# Patient Record
Sex: Male | Born: 1980 | Race: Black or African American | Hispanic: No | Marital: Single | Smoking: Current every day smoker
Health system: Southern US, Community
[De-identification: ages and names within clinical notes are randomized; demographics above are authoritative.]

## PROBLEM LIST (undated history)

## (undated) DIAGNOSIS — G4730 Sleep apnea, unspecified: Secondary | ICD-10-CM

## (undated) DIAGNOSIS — I10 Essential (primary) hypertension: Secondary | ICD-10-CM

---

## 2008-06-10 ENCOUNTER — Emergency Department (HOSPITAL_COMMUNITY): Admission: EM | Admit: 2008-06-10 | Discharge: 2008-06-10 | Payer: Self-pay | Admitting: Emergency Medicine

## 2008-06-13 ENCOUNTER — Emergency Department (HOSPITAL_COMMUNITY): Admission: EM | Admit: 2008-06-13 | Discharge: 2008-06-13 | Payer: Self-pay | Admitting: Emergency Medicine

## 2009-02-03 ENCOUNTER — Emergency Department (HOSPITAL_COMMUNITY): Admission: EM | Admit: 2009-02-03 | Discharge: 2009-02-04 | Payer: Self-pay | Admitting: Emergency Medicine

## 2009-02-07 ENCOUNTER — Ambulatory Visit: Payer: Self-pay | Admitting: Internal Medicine

## 2009-02-07 DIAGNOSIS — R030 Elevated blood-pressure reading, without diagnosis of hypertension: Secondary | ICD-10-CM

## 2009-02-07 DIAGNOSIS — K219 Gastro-esophageal reflux disease without esophagitis: Secondary | ICD-10-CM

## 2009-02-07 DIAGNOSIS — J309 Allergic rhinitis, unspecified: Secondary | ICD-10-CM | POA: Insufficient documentation

## 2009-02-07 DIAGNOSIS — M549 Dorsalgia, unspecified: Secondary | ICD-10-CM | POA: Insufficient documentation

## 2009-02-07 DIAGNOSIS — M25569 Pain in unspecified knee: Secondary | ICD-10-CM

## 2009-02-07 DIAGNOSIS — R1013 Epigastric pain: Secondary | ICD-10-CM

## 2009-02-07 DIAGNOSIS — R21 Rash and other nonspecific skin eruption: Secondary | ICD-10-CM

## 2009-02-07 DIAGNOSIS — J45909 Unspecified asthma, uncomplicated: Secondary | ICD-10-CM | POA: Insufficient documentation

## 2009-02-07 DIAGNOSIS — G4733 Obstructive sleep apnea (adult) (pediatric): Secondary | ICD-10-CM

## 2009-02-08 LAB — CONVERTED CEMR LAB
ALT: 22 units/L (ref 0–53)
AST: 22 units/L (ref 0–37)
Albumin: 4 g/dL (ref 3.5–5.2)
Basophils Absolute: 0.2 10*3/uL — ABNORMAL HIGH (ref 0.0–0.1)
CO2: 29 meq/L (ref 19–32)
GFR calc non Af Amer: 113.46 mL/min (ref >60)
Glucose, Bld: 98 mg/dL (ref 70–99)
HCT: 42.5 % (ref 39.0–52.0)
Hemoglobin: 14.3 g/dL (ref 13.0–17.0)
Lymphs Abs: 1.8 10*3/uL (ref 0.7–4.0)
Monocytes Relative: 10.3 % (ref 3.0–12.0)
Neutro Abs: 2.8 10*3/uL (ref 1.4–7.7)
Potassium: 3.9 meq/L (ref 3.5–5.1)
RDW: 15.2 % — ABNORMAL HIGH (ref 11.5–14.6)
Sodium: 142 meq/L (ref 135–145)
TSH: 0.69 microintl units/mL (ref 0.35–5.50)
Total Protein: 7.3 g/dL (ref 6.0–8.3)

## 2009-02-09 ENCOUNTER — Encounter: Payer: Self-pay | Admitting: Internal Medicine

## 2009-02-09 DIAGNOSIS — A6000 Herpesviral infection of urogenital system, unspecified: Secondary | ICD-10-CM | POA: Insufficient documentation

## 2009-02-09 LAB — CONVERTED CEMR LAB

## 2009-02-20 ENCOUNTER — Telehealth (INDEPENDENT_AMBULATORY_CARE_PROVIDER_SITE_OTHER): Payer: Self-pay | Admitting: *Deleted

## 2009-02-22 ENCOUNTER — Emergency Department (HOSPITAL_COMMUNITY): Admission: EM | Admit: 2009-02-22 | Discharge: 2009-02-22 | Payer: Self-pay | Admitting: Emergency Medicine

## 2009-02-22 ENCOUNTER — Telehealth: Payer: Self-pay | Admitting: Internal Medicine

## 2009-03-08 ENCOUNTER — Emergency Department (HOSPITAL_COMMUNITY): Admission: EM | Admit: 2009-03-08 | Discharge: 2009-03-08 | Payer: Self-pay | Admitting: Emergency Medicine

## 2009-05-06 ENCOUNTER — Emergency Department (HOSPITAL_COMMUNITY): Admission: EM | Admit: 2009-05-06 | Discharge: 2009-05-06 | Payer: Self-pay | Admitting: Emergency Medicine

## 2010-04-03 IMAGING — CR DG HAND COMPLETE 3+V*R*
3 series · 3 of 3 positions shown · non-contrast
Comparison: None

CLINICAL DATA: Right hand injury

RIGHT HAND - COMPLETE 3+ VIEW

[x hand ap right]
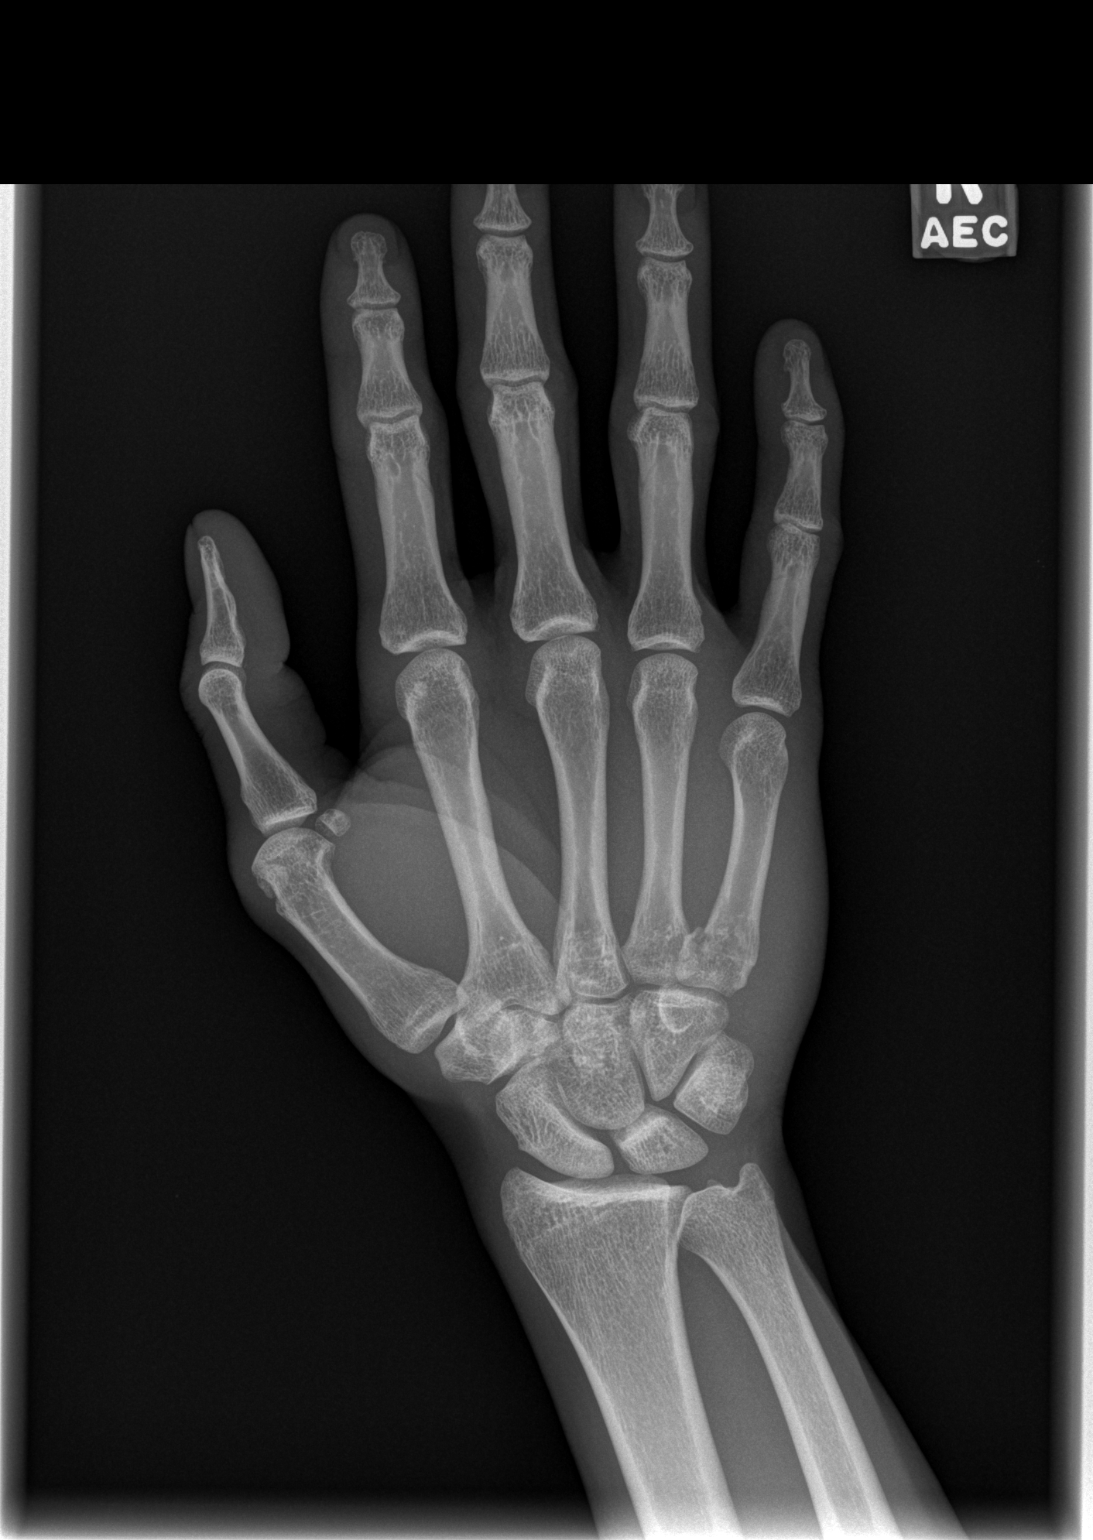

[x hand oblique right]
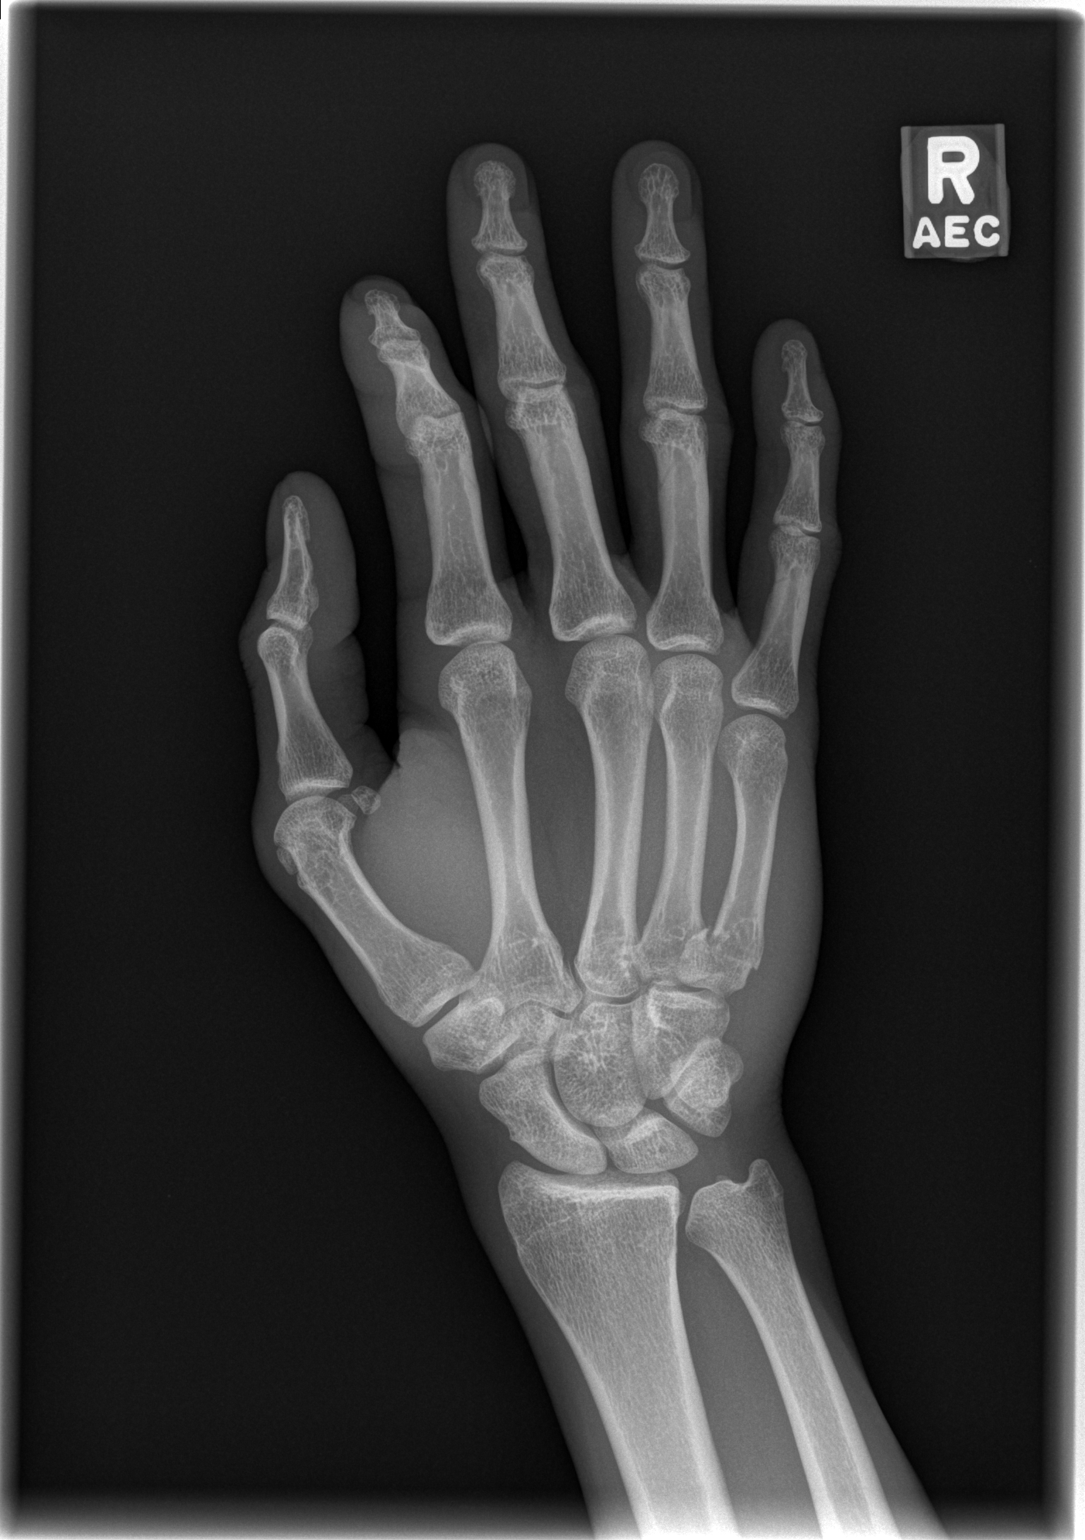

[x hand lat right]
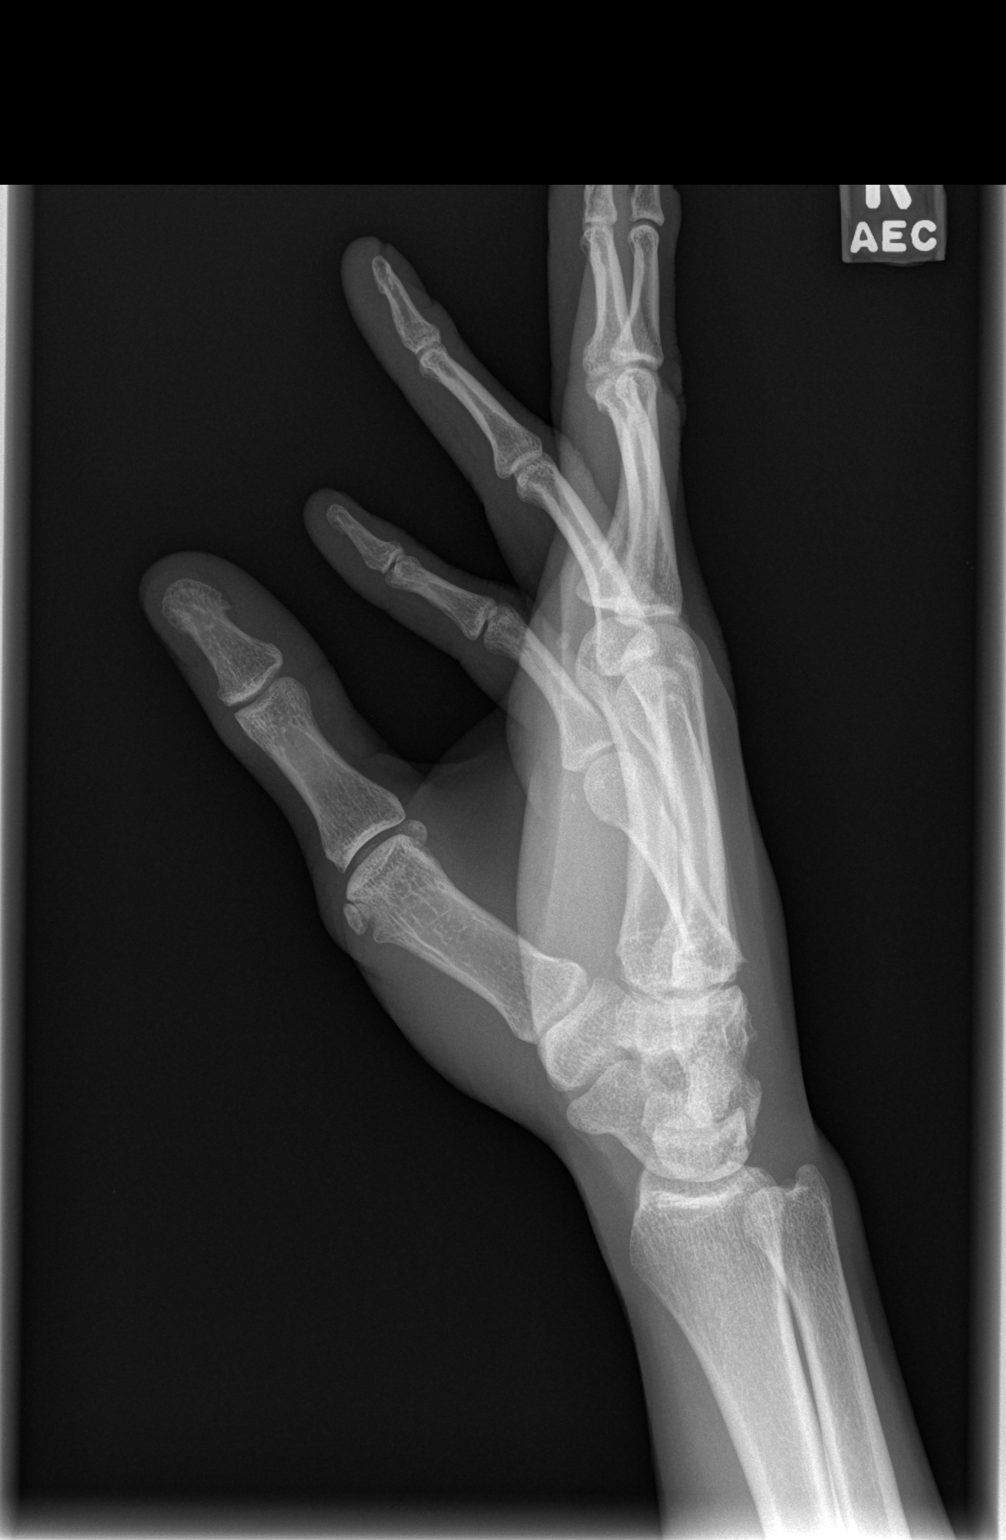

[3 of 3 positions shown; findings below may reference images not displayed]

FINDINGS: There is a minimally displaced fracture at the base of
the fifth metacarpal with overlying soft tissue swelling.  No
associated dislocation.  No other fractures are identified.
IMPRESSION: Mildly displaced fracture of proximal fifth metacarpal.

## 2010-10-13 ENCOUNTER — Emergency Department (HOSPITAL_COMMUNITY)
Admission: EM | Admit: 2010-10-13 | Discharge: 2010-10-13 | Payer: Self-pay | Source: Home / Self Care | Admitting: Emergency Medicine

## 2010-10-14 LAB — MAGNESIUM: Magnesium: 2.3 mg/dL (ref 1.5–2.5)

## 2010-10-14 LAB — RAPID URINE DRUG SCREEN, HOSP PERFORMED
Amphetamines: NOT DETECTED
Barbiturates: NOT DETECTED

## 2010-10-14 LAB — POCT I-STAT, CHEM 8
HCT: 47 % (ref 39.0–52.0)
Hemoglobin: 16 g/dL (ref 13.0–17.0)
Potassium: 3.5 mEq/L (ref 3.5–5.1)
Sodium: 140 mEq/L (ref 135–145)

## 2010-10-14 LAB — PHOSPHORUS: Phosphorus: 3.4 mg/dL (ref 2.3–4.6)

## 2011-03-12 ENCOUNTER — Emergency Department (HOSPITAL_COMMUNITY)
Admission: EM | Admit: 2011-03-12 | Discharge: 2011-03-12 | Disposition: A | Discharge status: Home / Self Care | Payer: Self-pay | Attending: Emergency Medicine | Admitting: Emergency Medicine

## 2011-03-12 DIAGNOSIS — R51 Headache: Secondary | ICD-10-CM | POA: Insufficient documentation

## 2011-03-12 DIAGNOSIS — R071 Chest pain on breathing: Secondary | ICD-10-CM | POA: Insufficient documentation

## 2011-03-12 DIAGNOSIS — K921 Melena: Secondary | ICD-10-CM | POA: Insufficient documentation

## 2011-03-12 DIAGNOSIS — K649 Unspecified hemorrhoids: Secondary | ICD-10-CM | POA: Insufficient documentation

## 2011-03-12 DIAGNOSIS — I10 Essential (primary) hypertension: Secondary | ICD-10-CM | POA: Insufficient documentation

## 2011-03-12 DIAGNOSIS — R5381 Other malaise: Secondary | ICD-10-CM | POA: Insufficient documentation

## 2011-03-12 LAB — COMPREHENSIVE METABOLIC PANEL
ALT: 24 U/L (ref 0–53)
CO2: 26 mEq/L (ref 19–32)
Calcium: 9.7 mg/dL (ref 8.4–10.5)
Chloride: 99 mEq/L (ref 96–112)
Creatinine, Ser: 1.02 mg/dL (ref 0.50–1.35)
GFR calc Af Amer: >60 mL/min (ref >60)
GFR calc non Af Amer: >60 mL/min (ref >60)
Glucose, Bld: 135 mg/dL — ABNORMAL HIGH (ref 70–99)
Sodium: 137 mEq/L (ref 135–145)
Total Bilirubin: 0.5 mg/dL (ref 0.3–1.2)

## 2011-03-12 LAB — CBC
Hemoglobin: 14.3 g/dL (ref 13.0–17.0)
MCH: 24.2 pg — ABNORMAL LOW (ref 26.0–34.0)
MCV: 72.8 fL — ABNORMAL LOW (ref 78.0–100.0)
RBC: 5.92 MIL/uL — ABNORMAL HIGH (ref 4.22–5.81)
WBC: 12.9 10*3/uL — ABNORMAL HIGH (ref 4.0–10.5)

## 2011-03-12 LAB — URINALYSIS, ROUTINE W REFLEX MICROSCOPIC
Glucose, UA: NEGATIVE mg/dL
Hgb urine dipstick: NEGATIVE
Specific Gravity, Urine: 1.024 (ref 1.005–1.030)
pH: 5.5 (ref 5.0–8.0)

## 2011-03-12 LAB — DIFFERENTIAL
Lymphocytes Relative: 11 % — ABNORMAL LOW (ref 12–46)
Lymphs Abs: 1.4 10*3/uL (ref 0.7–4.0)
Monocytes Relative: 6 % (ref 3–12)
Neutro Abs: 10.7 10*3/uL — ABNORMAL HIGH (ref 1.7–7.7)
Neutrophils Relative %: 83 % — ABNORMAL HIGH (ref 43–77)

## 2011-10-08 ENCOUNTER — Emergency Department (HOSPITAL_COMMUNITY): Payer: No Typology Code available for payment source

## 2011-10-08 ENCOUNTER — Emergency Department (HOSPITAL_COMMUNITY)
Admission: EM | Admit: 2011-10-08 | Discharge: 2011-10-08 | Disposition: A | Discharge status: Home / Self Care | Payer: No Typology Code available for payment source | Attending: Emergency Medicine | Admitting: Emergency Medicine

## 2011-10-08 DIAGNOSIS — M545 Low back pain, unspecified: Secondary | ICD-10-CM | POA: Insufficient documentation

## 2011-10-08 DIAGNOSIS — S39012A Strain of muscle, fascia and tendon of lower back, initial encounter: Secondary | ICD-10-CM

## 2011-10-08 DIAGNOSIS — S335XXA Sprain of ligaments of lumbar spine, initial encounter: Secondary | ICD-10-CM | POA: Insufficient documentation

## 2011-10-08 MED ORDER — IBUPROFEN 600 MG PO TABS: 600.0000 mg | ORAL_TABLET | Freq: Three times a day (TID) | ORAL | Status: AC | PRN
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
## 2012-06-13 ENCOUNTER — Emergency Department (HOSPITAL_COMMUNITY)
Admission: EM | Admit: 2012-06-13 | Discharge: 2012-06-13 | Disposition: A | Discharge status: Home / Self Care | Payer: Self-pay | Attending: Emergency Medicine | Admitting: Emergency Medicine

## 2012-06-13 DIAGNOSIS — K0889 Other specified disorders of teeth and supporting structures: Secondary | ICD-10-CM

## 2012-06-13 DIAGNOSIS — K089 Disorder of teeth and supporting structures, unspecified: Secondary | ICD-10-CM | POA: Insufficient documentation

## 2012-06-13 MED ORDER — OXYCODONE-ACETAMINOPHEN 5-325 MG PO TABS
2.0000 | ORAL_TABLET | Freq: Once | ORAL | Status: AC
  Administered 2012-06-13: 2 via ORAL
  Filled 2012-06-13: qty 2

## 2012-06-13 MED ORDER — PENICILLIN V POTASSIUM 250 MG PO TABS
500.0000 mg | ORAL_TABLET | Freq: Once | ORAL | Status: AC
  Administered 2012-06-13: 500 mg via ORAL
  Filled 2012-06-13: qty 2

## 2012-06-13 MED ORDER — OXYCODONE-ACETAMINOPHEN 5-325 MG PO TABS: 1.0000 | ORAL_TABLET | Freq: Four times a day (QID) | ORAL | Status: DC | PRN

## 2012-06-13 MED ORDER — PENICILLIN V POTASSIUM 500 MG PO TABS: 500.0000 mg | ORAL_TABLET | Freq: Four times a day (QID) | ORAL | Status: AC

## 2012-06-13 NOTE — ED Provider Notes (Signed)
History  This chart was scribed for Charles B. Bernette Mayers, MD by Shari Heritage. The patient was seen in room TR07C/TR07C. Patient's care was started at 0933.     CSN: [id number]  Arrival date & time 06/13/12  4098   First MD Initiated Contact with Patient 06/13/12 [phone]      Chief Complaint  Patient presents with  . Dental Pain    The history is provided by the patient. No language interpreter was used.    John Vincent is a 31 y.o. male who presents to the Emergency Department complaining of moderate to severe, constant left upper dental pain onset yesterday. Patient states that he broke a tooth about 1 week ago. He has applied Orajel to the area with no relief. Patient doesn't have a dentist. He denies any allergies. He has a history of HTN, but does not take any medications. Patient states that his wife drove him to the ED.  No past medical history on file.  No past surgical history on file.  No family history on file.  History  Substance Use Topics  . Smoking status: Not on file  . Smokeless tobacco: Not on file  . Alcohol Use: Not on file      Review of Systems A complete 10 system review of systems was obtained and all systems are negative except as noted in the HPI and PMH.   Allergies  Review of patient's allergies indicates no known allergies.  Home Medications  No current outpatient prescriptions on file.  BP 139/75  Pulse 67  Temp 98 F (36.7 C)  Resp 16  SpO2 98%  Physical Exam  Nursing note and vitals reviewed. Constitutional: He is oriented to person, place, and time. He appears well-developed and well-nourished.  HENT:  Head: Normocephalic and atraumatic.       Left 1st molar is broken. Tender to palpation. No swelling. No abscess.  Eyes: EOM are normal. Pupils are equal, round, and reactive to light.  Neck: Normal range of motion. Neck supple.  Cardiovascular: Normal rate, normal heart sounds and intact distal pulses.   Pulmonary/Chest: Effort  normal and breath sounds normal.  Abdominal: Bowel sounds are normal. He exhibits no distension. There is no tenderness.  Musculoskeletal: Normal range of motion. He exhibits no edema and no tenderness.  Neurological: He is alert and oriented to person, place, and time. He has normal strength. No cranial nerve deficit or sensory deficit.  Skin: Skin is warm and dry. No rash noted.  Psychiatric: He has a normal mood and affect.    ED Course  Procedures (including critical care time) DIAGNOSTIC STUDIES: Oxygen Saturation is 98% on room air, normal by my interpretation.    COORDINATION OF CARE: 9:45am- Patient informed of current plan for treatment and evaluation and agrees with plan at this time.      Labs Reviewed - No data to display No results found.   1. Toothache       MDM  Advised dental followup. Pain meds, PCN.      I personally performed the services described in the documentation, which were scribed in my presence. The recorded information has been reviewed and considered.     Charles B. Bernette Mayers, MD 06/13/12 1649

## 2012-06-13 NOTE — ED Notes (Signed)
To ED for eval of left upper tooth pain past breaking last week.

## 2012-09-17 ENCOUNTER — Encounter (HOSPITAL_COMMUNITY): Payer: Self-pay | Admitting: Emergency Medicine

## 2012-09-17 ENCOUNTER — Emergency Department (HOSPITAL_COMMUNITY)
Admission: EM | Admit: 2012-09-17 | Discharge: 2012-09-17 | Disposition: A | Discharge status: Home / Self Care | Payer: Self-pay | Attending: Emergency Medicine | Admitting: Emergency Medicine

## 2012-09-17 DIAGNOSIS — K0889 Other specified disorders of teeth and supporting structures: Secondary | ICD-10-CM

## 2012-09-17 DIAGNOSIS — I10 Essential (primary) hypertension: Secondary | ICD-10-CM | POA: Insufficient documentation

## 2012-09-17 DIAGNOSIS — G4730 Sleep apnea, unspecified: Secondary | ICD-10-CM | POA: Insufficient documentation

## 2012-09-17 DIAGNOSIS — R21 Rash and other nonspecific skin eruption: Secondary | ICD-10-CM | POA: Insufficient documentation

## 2012-09-17 DIAGNOSIS — K089 Disorder of teeth and supporting structures, unspecified: Secondary | ICD-10-CM | POA: Insufficient documentation

## 2012-09-17 DIAGNOSIS — F17200 Nicotine dependence, unspecified, uncomplicated: Secondary | ICD-10-CM | POA: Insufficient documentation

## 2012-09-17 HISTORY — DX: Sleep apnea, unspecified: G47.30

## 2012-09-17 HISTORY — DX: Essential (primary) hypertension: I10

## 2012-09-17 MED ORDER — HYDROCODONE-ACETAMINOPHEN 5-325 MG PO TABS: 1.0000 | ORAL_TABLET | Freq: Four times a day (QID) | ORAL | Status: DC | PRN

## 2012-09-17 MED ORDER — HYDROCORTISONE 1 % EX CREA: TOPICAL_CREAM | CUTANEOUS | Status: DC

## 2012-09-17 MED ORDER — PENICILLIN V POTASSIUM 500 MG PO TABS: 500.0000 mg | ORAL_TABLET | Freq: Four times a day (QID) | ORAL | Status: DC

## 2012-09-17 NOTE — ED Notes (Signed)
Pt escorted to discharge window. Pt verbalized understanding discharge instructions. In no acute distress.  

## 2012-09-17 NOTE — ED Provider Notes (Signed)
History     CSN: [id number]  Arrival date & time 09/17/12  8756   First MD Initiated Contact with Patient 09/17/12 [phone]      Chief Complaint  Patient presents with  . Dental Pain  . Rash    (Consider location/radiation/quality/duration/timing/severity/associated sxs/prior treatment) HPI Comments: This is a 31 year old male, who presents emergency department with chief complaint of dental pain. He also complains of a rash that he noticed on his back. States to the dental pain began last night after eating dinner, and one of his teeth broke. The location is the top upper left molar. Patient states that his pain as severe. He is taking Tylenol to alleviate her symptoms. Patient states that he will go see his dentist next week, but is requesting pain medicine to get him by the meantime. Patient also states that he noticed a rash 2 days ago, it is nonpainful, does not itch. He has not tried anything to alleviate his symptoms. There are no aggravating or alleviating factors. Patient denies headache, blurred vision, new hearing loss, sore throat, chest pain, shortness of breath, nausea, vomiting, diarrhea, constipation, dysuria, peripheral edema, back pain, numbness or tingling of the extremities.   The history is provided by the patient. No language interpreter was used.    Past Medical History  Diagnosis Date  . Hypertension   . Sleep apnea     History reviewed. No pertinent past surgical history.  History reviewed. No pertinent family history.  History  Substance Use Topics  . Smoking status: Current Every Day Smoker -- 0.5 packs/day    Types: Cigarettes  . Smokeless tobacco: Not on file  . Alcohol Use: No      Review of Systems  All other systems reviewed and are negative.    Allergies  Review of patient's allergies indicates no known allergies.  Home Medications   Current Outpatient Rx  Name  Route  Sig  Dispense  Refill  . HYDROCODONE-ACETAMINOPHEN 5-325 MG PO  TABS   Oral   Take 1 tablet by mouth every 6 (six) hours as needed for pain.   12 tablet   0   . HYDROCORTISONE 1 % EX CREA      Apply to affected area 2 times daily   15 g   0   . OXYCODONE-ACETAMINOPHEN 5-325 MG PO TABS   Oral   Take 1-2 tablets by mouth every 6 (six) hours as needed for pain.   20 tablet   0     BP 156/85  Pulse 74  Temp 97.4 F (36.3 C) (Oral)  Resp 16  SpO2 97%  Physical Exam  Nursing note and vitals reviewed. Constitutional: He is oriented to person, place, and time. He appears well-developed and well-nourished.  HENT:  Head: Normocephalic and atraumatic.  Mouth/Throat:         No signs of infection and surrounding gingival tissue.  Eyes: Conjunctivae normal and EOM are normal.  Neck: Normal range of motion.  Cardiovascular: Normal rate.   Pulmonary/Chest: Effort normal.  Abdominal: He exhibits no distension.  Musculoskeletal: Normal range of motion.  Neurological: He is alert and oriented to person, place, and time.  Skin: Skin is dry.     Psychiatric: He has a normal mood and affect. His behavior is normal. Judgment and thought content normal.    ED Course  Procedures (including critical care time)  Labs Reviewed - No data to display No results found.   1. Pain, dental   2.  Rash       MDM  31 year old male with dental pain and rash., Treat the patient's dental pain with Norco and penicillin. Will prescribe hydrocortisone cream for the rash. Patient is instructed to followup with his primary care doctor. Patient understands and agrees with the plan. He is stable and ready for discharge.        Roxy Horseman, PA-C 09/17/12 [phone]

## 2012-09-17 NOTE — ED Provider Notes (Signed)
Medical screening examination/treatment/procedure(s) were performed by non-physician practitioner and as supervising physician I was immediately available for consultation/collaboration.   Gavin Pound. Nikia Levels, MD 09/17/12 1142

## 2012-09-17 NOTE — ED Notes (Signed)
Pt states that he is having tooth pain on the upper left side.  Also c/o rash on his back.

## 2012-09-17 NOTE — ED Notes (Signed)
PA at bedside Pt alert and oriented x4. Respirations even and unlabored, bilateral symmetrical rise and fall of chest. Skin warm and dry. In no acute distress. Denies needs.   

## 2012-12-06 ENCOUNTER — Emergency Department (HOSPITAL_COMMUNITY)
Admission: EM | Admit: 2012-12-06 | Discharge: 2012-12-06 | Disposition: A | Discharge status: Home / Self Care | Payer: Self-pay | Attending: Emergency Medicine | Admitting: Emergency Medicine

## 2012-12-06 ENCOUNTER — Encounter (HOSPITAL_COMMUNITY): Payer: Self-pay

## 2012-12-06 DIAGNOSIS — K645 Perianal venous thrombosis: Secondary | ICD-10-CM | POA: Insufficient documentation

## 2012-12-06 DIAGNOSIS — I10 Essential (primary) hypertension: Secondary | ICD-10-CM | POA: Insufficient documentation

## 2012-12-06 DIAGNOSIS — F17200 Nicotine dependence, unspecified, uncomplicated: Secondary | ICD-10-CM | POA: Insufficient documentation

## 2012-12-06 MED ORDER — HYDROCORTISONE 2.5 % RE CREA
TOPICAL_CREAM | Freq: Once | RECTAL | Status: AC
  Administered 2012-12-06: 1 via TOPICAL
  Filled 2012-12-06: qty 28.35

## 2012-12-06 MED ORDER — DOCUSATE SODIUM 100 MG PO CAPS
100.0000 mg | ORAL_CAPSULE | Freq: Two times a day (BID) | ORAL | Status: AC
Start: 2012-12-06 — End: ?

## 2012-12-06 MED ORDER — HYDROCORTISONE 2.5 % RE CREA: TOPICAL_CREAM | RECTAL | Status: AC

## 2012-12-06 NOTE — ED Notes (Signed)
Patient presents with c/o "boil" on buttock. First noticed it yesterday. Denies any drainage or fever. Patient states that "it hurts" and will not cooperate with RN. Patient sleeping at triage.

## 2012-12-06 NOTE — ED Notes (Signed)
Pt very sleepy but he walked back to treatment room

## 2012-12-07 NOTE — ED Provider Notes (Signed)
History     CSN: [id number]  Arrival date & time 12/06/12  0116   First MD Initiated Contact with Patient 12/06/12 0131      No chief complaint on file.   (Consider location/radiation/quality/duration/timing/severity/associated sxs/prior treatment) HPI Comments: "boil on buttock" noticed last night   The history is provided by the patient.    Past Medical History  Diagnosis Date  . Hypertension   . Sleep apnea     History reviewed. No pertinent past surgical history.  No family history on file.  History  Substance Use Topics  . Smoking status: Current Every Day Smoker -- 0.50 packs/day    Types: Cigarettes  . Smokeless tobacco: Not on file  . Alcohol Use: No      Review of Systems  Gastrointestinal: Positive for rectal pain. Negative for nausea, vomiting, diarrhea and constipation.  All other systems reviewed and are negative.    Allergies  Review of patient's allergies indicates no known allergies.  Home Medications   Current Outpatient Rx  Name  Route  Sig  Dispense  Refill  . ibuprofen (ADVIL,MOTRIN) 200 MG tablet   Oral   Take 200 mg by mouth every 6 (six) hours as needed for pain.         Marland Kitchen docusate sodium (COLACE) 100 MG capsule   Oral   Take 1 capsule (100 mg total) by mouth every 12 (twelve) hours.   60 capsule   0   . hydrocortisone (ANUSOL-HC) 2.5 % rectal cream      Topically to area   30 g   0     BP 125/74  Pulse 75  Temp(Src) 97.7 F (36.5 C) (Oral)  Resp 16  SpO2 94%  Physical Exam  Constitutional: He is oriented to person, place, and time. He appears well-developed and well-nourished.  HENT:  Head: Normocephalic.  Eyes: Pupils are equal, round, and reactive to light.  Neck: Normal range of motion.  Pulmonary/Chest: He is in respiratory distress.  Abdominal: Soft. Bowel sounds are normal. He exhibits no distension. There is no tenderness.  Genitourinary: Rectal exam shows external hemorrhoid and tenderness.   Musculoskeletal: Normal range of motion.  Neurological: He is alert and oriented to person, place, and time.  Skin: Skin is warm.    ED Course  Procedures (including critical care time)  Labs Reviewed - No data to display No results found.   1. Hemorrhoid thrombosis       MDM  anusol cream stool softner and surgical referral         Arman Filter, NP 12/07/12 [phone]

## 2012-12-09 ENCOUNTER — Emergency Department (HOSPITAL_COMMUNITY)
Admission: EM | Admit: 2012-12-09 | Discharge: 2012-12-09 | Disposition: A | Discharge status: Home / Self Care | Payer: Self-pay | Attending: Emergency Medicine | Admitting: Emergency Medicine

## 2012-12-09 ENCOUNTER — Encounter (HOSPITAL_COMMUNITY): Payer: Self-pay | Admitting: Emergency Medicine

## 2012-12-09 DIAGNOSIS — K6289 Other specified diseases of anus and rectum: Secondary | ICD-10-CM | POA: Insufficient documentation

## 2012-12-09 DIAGNOSIS — K644 Residual hemorrhoidal skin tags: Secondary | ICD-10-CM | POA: Insufficient documentation

## 2012-12-09 DIAGNOSIS — K625 Hemorrhage of anus and rectum: Secondary | ICD-10-CM | POA: Insufficient documentation

## 2012-12-09 DIAGNOSIS — I10 Essential (primary) hypertension: Secondary | ICD-10-CM | POA: Insufficient documentation

## 2012-12-09 DIAGNOSIS — F17200 Nicotine dependence, unspecified, uncomplicated: Secondary | ICD-10-CM | POA: Insufficient documentation

## 2012-12-09 NOTE — ED Provider Notes (Signed)
History     CSN: [id number]  Arrival date & time 12/09/12  0217   First MD Initiated Contact with Patient 12/09/12 [phone]      Chief Complaint  Patient presents with  . Hemorrhoids    (Consider location/radiation/quality/duration/timing/severity/associated sxs/prior treatment) Patient is a 32 y.o. male presenting with hematochezia. The history is provided by the patient.  Rectal Bleeding  The current episode started today. The problem occurs rarely. The problem has been unchanged. The pain is mild. The stool is described as soft. There was no prior successful therapy. There was no prior unsuccessful therapy. Associated symptoms include hemorrhoids and rectal pain. Recently, medical care has been given at this facility. Services received include medications given.    Past Medical History  Diagnosis Date  . Hypertension   . Sleep apnea     History reviewed. No pertinent past surgical history.  No family history on file.  History  Substance Use Topics  . Smoking status: Current Every Day Smoker -- 0.50 packs/day    Types: Cigarettes  . Smokeless tobacco: Not on file  . Alcohol Use: No      Review of Systems  Gastrointestinal: Positive for hematochezia, anal bleeding, rectal pain and hemorrhoids.  All other systems reviewed and are negative.    Allergies  Review of patient's allergies indicates no known allergies.  Home Medications   Current Outpatient Rx  Name  Route  Sig  Dispense  Refill  . docusate sodium (COLACE) 100 MG capsule   Oral   Take 1 capsule (100 mg total) by mouth every 12 (twelve) hours.   60 capsule   0   . hydrocortisone (ANUSOL-HC) 2.5 % rectal cream      Topically to area   30 g   0   . ibuprofen (ADVIL,MOTRIN) 200 MG tablet   Oral   Take 200 mg by mouth every 6 (six) hours as needed for pain.           BP 145/86  Pulse 70  Temp(Src) 97.8 F (36.6 C)  Resp 24  SpO2 94%  Physical Exam  Constitutional: He is oriented to  person, place, and time. He appears well-developed and well-nourished.  HENT:  Head: Normocephalic and atraumatic.  Eyes: Conjunctivae are normal. Pupils are equal, round, and reactive to light.  Neck: Normal range of motion. Neck supple.  Cardiovascular: Normal rate, regular rhythm, normal heart sounds and intact distal pulses.   Pulmonary/Chest: Effort normal and breath sounds normal.  Abdominal: Soft. Bowel sounds are normal.  Genitourinary:     Bleeding external hemorroid  Neurological: He is alert and oriented to person, place, and time.  Skin: Skin is warm and dry.  Psychiatric: He has a normal mood and affect. His behavior is normal. Judgment and thought content normal.    ED Course  Procedures (including critical care time)  Labs Reviewed - No data to display No results found.   1. External hemorrhoid, bleeding       MDM  + scant bleeding from external hemorroid.  Will have fu oupt surgery,  Ret new/worsening sxs        Kamdin Follett Lytle Michaels, MD 12/09/12 [phone]

## 2012-12-09 NOTE — ED Notes (Signed)
PT. REPORTS HEMORRHOID PAIN/BLEEDING ONSET THIS EVENING  DENIES INJURY .

## 2012-12-10 NOTE — ED Provider Notes (Signed)
Medical screening examination/treatment/procedure(s) were performed by non-physician practitioner and as supervising physician I was immediately available for consultation/collaboration.  Erric Machnik K Addley Ballinger-Rasch, MD 12/10/12 0131 

## 2013-11-08 ENCOUNTER — Institutional Professional Consult (permissible substitution): Payer: Self-pay | Admitting: Pulmonary Disease

## 2014-08-19 ENCOUNTER — Emergency Department (HOSPITAL_COMMUNITY): Payer: No Typology Code available for payment source

## 2014-08-19 ENCOUNTER — Emergency Department (HOSPITAL_COMMUNITY)
Admission: EM | Admit: 2014-08-19 | Discharge: 2014-08-19 | Disposition: A | Discharge status: Home / Self Care | Payer: No Typology Code available for payment source | Attending: Emergency Medicine | Admitting: Emergency Medicine

## 2014-08-19 ENCOUNTER — Encounter (HOSPITAL_COMMUNITY): Payer: Self-pay | Admitting: *Deleted

## 2014-08-19 DIAGNOSIS — Z720 Tobacco use: Secondary | ICD-10-CM | POA: Insufficient documentation

## 2014-08-19 DIAGNOSIS — Z7952 Long term (current) use of systemic steroids: Secondary | ICD-10-CM | POA: Insufficient documentation

## 2014-08-19 DIAGNOSIS — Y9289 Other specified places as the place of occurrence of the external cause: Secondary | ICD-10-CM | POA: Insufficient documentation

## 2014-08-19 DIAGNOSIS — Y998 Other external cause status: Secondary | ICD-10-CM | POA: Insufficient documentation

## 2014-08-19 DIAGNOSIS — L03011 Cellulitis of right finger: Secondary | ICD-10-CM | POA: Insufficient documentation

## 2014-08-19 DIAGNOSIS — S61210A Laceration without foreign body of right index finger without damage to nail, initial encounter: Secondary | ICD-10-CM | POA: Insufficient documentation

## 2014-08-19 DIAGNOSIS — S59901A Unspecified injury of right elbow, initial encounter: Secondary | ICD-10-CM | POA: Insufficient documentation

## 2014-08-19 DIAGNOSIS — S199XXA Unspecified injury of neck, initial encounter: Secondary | ICD-10-CM | POA: Insufficient documentation

## 2014-08-19 DIAGNOSIS — I10 Essential (primary) hypertension: Secondary | ICD-10-CM | POA: Insufficient documentation

## 2014-08-19 DIAGNOSIS — Z8669 Personal history of other diseases of the nervous system and sense organs: Secondary | ICD-10-CM | POA: Insufficient documentation

## 2014-08-19 DIAGNOSIS — Z79899 Other long term (current) drug therapy: Secondary | ICD-10-CM | POA: Insufficient documentation

## 2014-08-19 DIAGNOSIS — W292XXA Contact with other powered household machinery, initial encounter: Secondary | ICD-10-CM | POA: Insufficient documentation

## 2014-08-19 DIAGNOSIS — S61211A Laceration without foreign body of left index finger without damage to nail, initial encounter: Secondary | ICD-10-CM | POA: Insufficient documentation

## 2014-08-19 DIAGNOSIS — Y9389 Activity, other specified: Secondary | ICD-10-CM | POA: Insufficient documentation

## 2014-08-19 LAB — CBG MONITORING, ED: GLUCOSE-CAPILLARY: 119 mg/dL — AB (ref 70–99)

## 2014-08-19 MED ORDER — HYDROCODONE-ACETAMINOPHEN 5-325 MG PO TABS: 2.0000 | ORAL_TABLET | ORAL | Status: DC | PRN

## 2014-08-19 MED ORDER — SULFAMETHOXAZOLE-TRIMETHOPRIM 800-160 MG PO TABS
1.0000 | ORAL_TABLET | Freq: Two times a day (BID) | ORAL | Status: AC
Start: 2014-08-19 — End: ?

## 2014-08-19 NOTE — ED Provider Notes (Signed)
CSN: [id number]     Arrival date & time 08/19/14  1448 History   This chart was scribed for Quest DiagnosticsLeslie K. Joylene GrapesSofia, PA-C, working with Warnell Foresterrey Wofford, MD by Chestine SporeSoijett Blue, ED Scribe. The patient was seen in room WTR7/WTR7 at 3:45 PM.   Chief Complaint  Patient presents with  . Finger Injury     The history is provided by the patient. No language interpreter was used.    HPI Comments: John Vincent is a 33 y.o. male who presents to the Emergency Department complaining of finger injury onset 3-4 days. He was working on a car with his friend and a fan cut him and it split to the white meat. He placed a steri-strip, band-aid on it. It initially got better but now he noticed that it has not healed. For the last month his finger was numb.   He states that he is having associated symptoms of numbness, joint swelling, neck pain, right arm pain, elbow pain. He has had intermittent neck pain and elbow pain of the right hand. He denies any other symptoms. He denies an X-ray of the finger in the past. Last tetanus 2-3 years ago. He brother has a hx of lupus that was recently dx. He reports that he displayed similar symptoms to that of his brother. He would like to be checked for lupus as well. Wants to be checked to see if he is a DM. He reports that he gets insurance this week.   Past Medical History  Diagnosis Date  . Hypertension   . Sleep apnea    History reviewed. No pertinent past surgical history. No family history on file. History  Substance Use Topics  . Smoking status: Current Every Day Smoker -- 0.50 packs/day    Types: Cigarettes  . Smokeless tobacco: Not on file  . Alcohol Use: No    Review of Systems  Musculoskeletal: Positive for joint swelling, arthralgias and neck pain.  Skin: Positive for wound (right index and left index finger).  Neurological: Positive for numbness.  All other systems reviewed and are negative.     Allergies  Review of patient's allergies indicates no known  allergies.  Home Medications   Prior to Admission medications   Medication Sig Start Date End Date Taking? Authorizing Provider  docusate sodium (COLACE) 100 MG capsule Take 1 capsule (100 mg total) by mouth every 12 (twelve) hours. 12/06/12   Arman FilterGail K Schulz, NP  hydrocortisone (ANUSOL-HC) 2.5 % rectal cream Topically to area 12/06/12   Arman FilterGail K Schulz, NP  ibuprofen (ADVIL,MOTRIN) 200 MG tablet Take 200 mg by mouth every 6 (six) hours as needed for pain.    Historical Provider, MD   BP 142/76 mmHg  Pulse 82  Temp(Src) 97.9 F (36.6 C) (Oral)  Resp 18  SpO2 100%  Physical Exam  Constitutional: He is oriented to person, place, and time. He appears well-developed and well-nourished. No distress.  HENT:  Head: Normocephalic and atraumatic.  Eyes: EOM are normal.  Neck: Neck supple. No tracheal deviation present.  Cardiovascular: Normal rate.   Pulmonary/Chest: Effort normal. No respiratory distress.  Musculoskeletal: Normal range of motion.  Neurological: He is alert and oriented to person, place, and time.  Skin: Skin is warm and dry. Laceration noted. There is erythema.  L index finger with healed lac and scar tissue. Erythema to R index finger. 1 cm healed lac on the R index.   Psychiatric: He has a normal mood and affect. His behavior is normal.  Nursing note and vitals reviewed.   ED Course  Procedures (including critical care time) DIAGNOSTIC STUDIES: Oxygen Saturation is 100% on room air, normal by my interpretation.    COORDINATION OF CARE: 3:54 PM-Discussed treatment plan which includes referral to hand surgeon, abx, right index finger X-ray with pt at bedside and pt agreed to plan.   Labs Review Labs Reviewed - No data to display  Imaging Review Dg Finger Index Right  08/19/2014   CLINICAL DATA:  Right index finger pain and swelling today. The patient cut the index finger at the PIP joint with a wrench while working on a car 5 days ago.  EXAM: RIGHT INDEX FINGER 2+V   COMPARISON:  Right hand dated 06/10/2008.  FINDINGS: There is no evidence of fracture or dislocation. There is no evidence of arthropathy or other focal bone abnormality. Soft tissues are unremarkable.  IMPRESSION: Normal examination.   Electronically Signed   By: Gordan PaymentSteve  Reid M.D.   On: 08/19/2014 16:37     EKG Interpretation None      MDM   Final diagnoses:  Cellulitis of right index finger   Pt given rx for bactrim and hydrocodone Schedule to see Dr. Merlyn LotKuzma for evaluation.    AVS  I personally performed the services described in this documentation, which was scribed in my presence. The recorded information has been reviewed and is accurate.    Lonia SkinnerLeslie K GoodrichSofia, PA-C 08/19/14 1648  Warnell Foresterrey Wofford, MD 08/21/14 0730

## 2014-08-19 NOTE — Discharge Instructions (Signed)

## 2014-08-19 NOTE — ED Notes (Signed)
Pt presents to ed with injury to right hand x 3-4 days ago. 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

## 2014-11-04 ENCOUNTER — Encounter (HOSPITAL_COMMUNITY): Payer: Self-pay

## 2014-11-04 ENCOUNTER — Emergency Department (HOSPITAL_COMMUNITY)
Admission: EM | Admit: 2014-11-04 | Discharge: 2014-11-04 | Disposition: A | Discharge status: Home / Self Care | Payer: Self-pay | Attending: Emergency Medicine | Admitting: Emergency Medicine

## 2014-11-04 DIAGNOSIS — K088 Other specified disorders of teeth and supporting structures: Secondary | ICD-10-CM | POA: Insufficient documentation

## 2014-11-04 DIAGNOSIS — Z8669 Personal history of other diseases of the nervous system and sense organs: Secondary | ICD-10-CM | POA: Insufficient documentation

## 2014-11-04 DIAGNOSIS — Z720 Tobacco use: Secondary | ICD-10-CM | POA: Insufficient documentation

## 2014-11-04 DIAGNOSIS — I10 Essential (primary) hypertension: Secondary | ICD-10-CM | POA: Insufficient documentation

## 2014-11-04 DIAGNOSIS — K0889 Other specified disorders of teeth and supporting structures: Secondary | ICD-10-CM

## 2014-11-04 MED ORDER — PENICILLIN V POTASSIUM 250 MG PO TABS: 250.0000 mg | ORAL_TABLET | Freq: Four times a day (QID) | ORAL | Status: AC

## 2014-11-04 MED ORDER — HYDROCODONE-ACETAMINOPHEN 5-325 MG PO TABS: 1.0000 | ORAL_TABLET | Freq: Four times a day (QID) | ORAL | Status: AC | PRN

## 2014-11-04 MED ORDER — HYDROCODONE-ACETAMINOPHEN 5-325 MG PO TABS
1.0000 | ORAL_TABLET | Freq: Once | ORAL | Status: AC
Start: 2014-11-04 — End: 2014-11-04
  Administered 2014-11-04: 1 via ORAL
  Filled 2014-11-04: qty 1

## 2014-11-04 NOTE — ED Provider Notes (Signed)
CSN: [id number]     Arrival date & time 11/04/14  1238 History  This chart was scribed for non-physician practitioner, Teressa LowerVrinda Carlin Mamone, FNP,working with Glynn OctaveStephen Rancour, MD, by Karle PlumberJennifer Tensley, ED Scribe. This patient was seen in room WTR8/WTR8 and the patient's care was started at 12:51 PM.  Chief Complaint  Patient presents with  . Dental Pain   Patient is a 34 y.o. male presenting with tooth pain. The history is provided by the patient and medical records. No language interpreter was used.  Dental Pain Associated symptoms: no facial swelling and no fever     HPI Comments:  John Sidlentonio Vincent is a 34 y.o. male who presents to the Emergency Department complaining of severe upper right-sided dental pain that started approximately one week ago. Pt states he fractured a tooth but cannot remember when it happened. He reports taking Tylenol for the pain with no significant relief of the pain. Cold air and eating makes the pain worse. He states he will have insurance in the next week so he will follow up with a dentist then. Denies alleviating factors. Denies fever, chills, facial swelling, trouble swallowing, nausea or vomiting. PMHx of HTN and sleep apnea.  Past Medical History  Diagnosis Date  . Hypertension   . Sleep apnea    History reviewed. No pertinent past surgical history. No family history on file. History  Substance Use Topics  . Smoking status: Current Every Day Smoker -- 0.50 packs/day    Types: Cigarettes  . Smokeless tobacco: Not on file  . Alcohol Use: No    Review of Systems  Constitutional: Negative for fever and chills.  HENT: Positive for dental problem. Negative for facial swelling and trouble swallowing.   Gastrointestinal: Negative for nausea and vomiting.  All other systems reviewed and are negative.   Allergies  Review of patient's allergies indicates no known allergies.  Home Medications   Prior to Admission medications   Medication Sig Start Date End  Date Taking? Authorizing Provider  docusate sodium (COLACE) 100 MG capsule Take 1 capsule (100 mg total) by mouth every 12 (twelve) hours. 12/06/12   Arman FilterGail K Schulz, NP  HYDROcodone-acetaminophen (NORCO/VICODIN) 5-325 MG per tablet Take 2 tablets by mouth every 4 (four) hours as needed. 08/19/14   Elson AreasLeslie K Sofia, PA-C  hydrocortisone (ANUSOL-HC) 2.5 % rectal cream Topically to area 12/06/12   Arman FilterGail K Schulz, NP  ibuprofen (ADVIL,MOTRIN) 200 MG tablet Take 200 mg by mouth every 6 (six) hours as needed for pain.    Historical Provider, MD  sulfamethoxazole-trimethoprim (SEPTRA DS) 800-160 MG per tablet Take 1 tablet by mouth every 12 (twelve) hours. 08/19/14   Elson AreasLeslie K Sofia, PA-C   Triage Vitals: BP 189/115 mmHg  Pulse 71  Temp(Src) 98 F (36.7 C) (Oral)  Resp 18  SpO2 100% Physical Exam  Constitutional: He is oriented to person, place, and time. He appears well-developed and well-nourished.  HENT:  Head: Normocephalic and atraumatic.  Right Ear: External ear normal.  Left Ear: External ear normal.  Mouth/Throat:    Generalized decay  Eyes: EOM are normal.  Neck: Normal range of motion.  Cardiovascular: Normal rate.   Pulmonary/Chest: Effort normal.  Musculoskeletal: Normal range of motion.  Neurological: He is alert and oriented to person, place, and time.  Skin: Skin is warm and dry.  Psychiatric: He has a normal mood and affect. His behavior is normal.  Nursing note and vitals reviewed.   ED Course  Procedures (including critical care time) DIAGNOSTIC  STUDIES: Oxygen Saturation is 100% on RA, normal by my interpretation.   COORDINATION OF CARE: 12:56 PM- Will prescribe antibiotics and pain medication. Will give pt dental resources. Pt verbalizes understanding and agrees to plan.  Medications - No data to display  Labs Review Labs Reviewed - No data to display  Imaging Review No results found.   EKG Interpretation None      MDM   Final diagnoses:  Toothache    Will treat with pcn and hydrocodone. Discussed pt recheck with pt   I personally performed the services described in this documentation, which was scribed in my presence. The recorded information has been reviewed and is accurate.    Teressa Lower, NP 11/04/14 1302  Glynn Octave, MD 11/04/14 [id number]

## 2014-11-04 NOTE — ED Notes (Signed)
Patient presents today with a chief complaint of right upper and lower molar tooth pain x 1 week. Patient reports he's unable to get to a dentist at this time due to lack of finances. Patient has taken tylenol without relief, and cool air makes pain worse.

## 2014-11-04 NOTE — Discharge Instructions (Signed)

## 2016-06-11 IMAGING — DX DG FINGER INDEX 2+V*R*
3 series · 3 of 3 positions shown · non-contrast
Comparison: Right hand dated 06/10/2008.

CLINICAL DATA: Right index finger pain and swelling today. The
patient cut the index finger at the PIP joint with a wrench while
working on a car 5 days ago.

EXAM:
RIGHT INDEX FINGER 2+V

[finger ap]
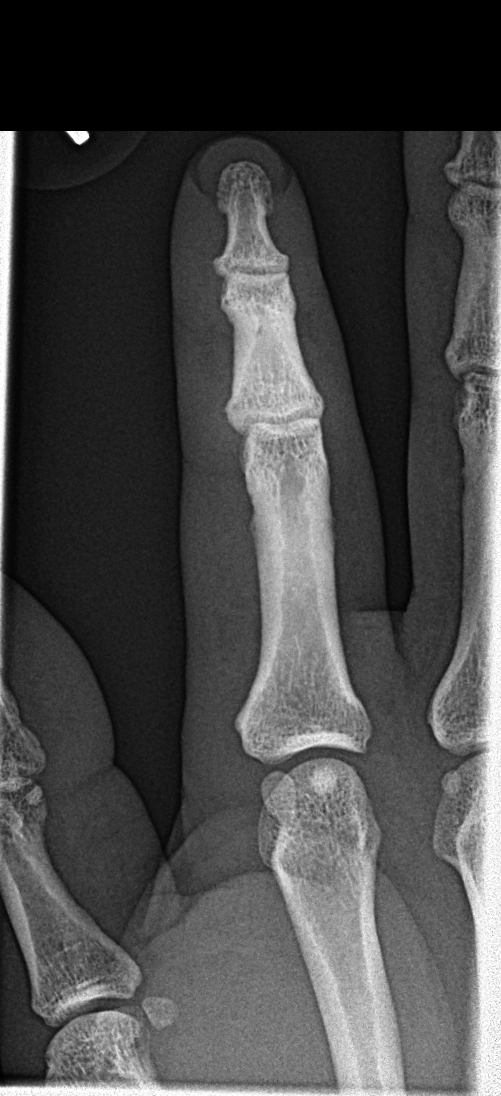

[finger lat]
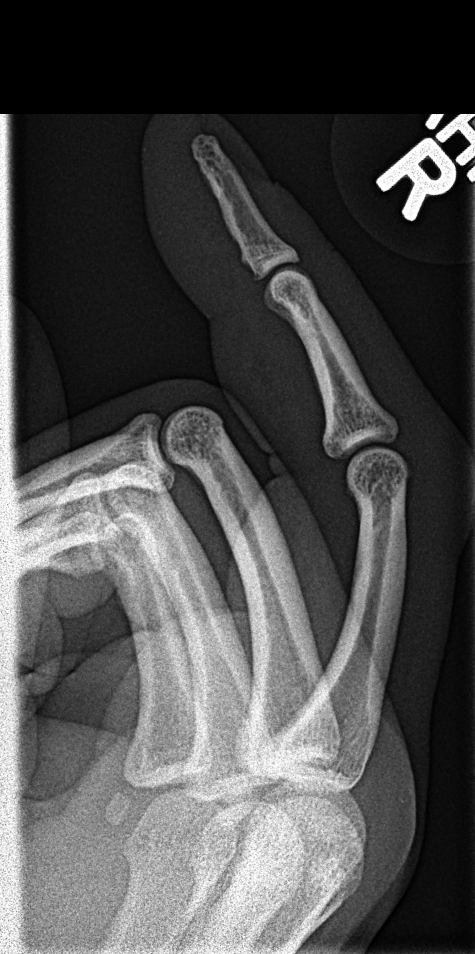

[finger obl]
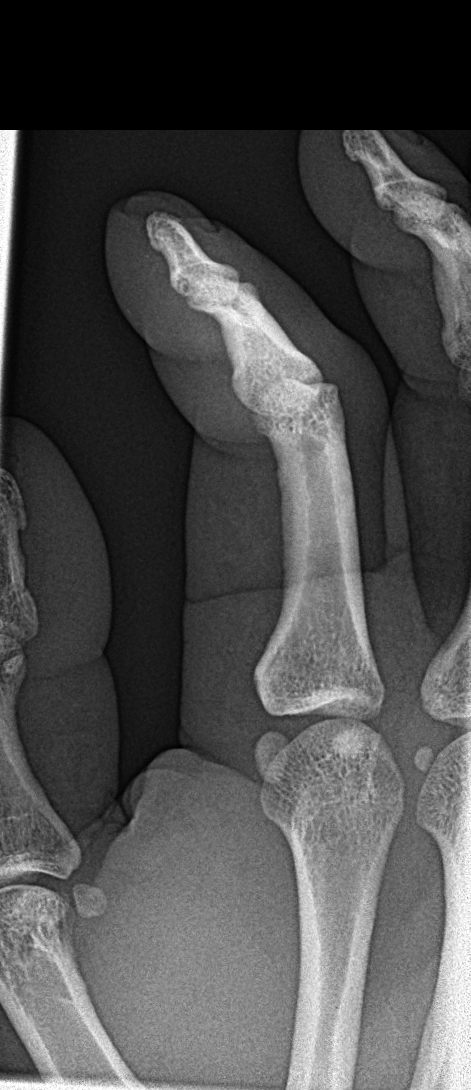

[3 of 3 positions shown; findings below may reference images not displayed]

FINDINGS: There is no evidence of fracture or dislocation. There is no
evidence of arthropathy or other focal bone abnormality. Soft
tissues are unremarkable.
IMPRESSION: Normal examination.
